# Patient Record
Sex: Female | Born: 2000
Health system: Southern US, Community
[De-identification: ages and names within clinical notes are randomized; demographics above are authoritative.]

## PROBLEM LIST (undated history)

## (undated) DIAGNOSIS — C801 Malignant (primary) neoplasm, unspecified: Secondary | ICD-10-CM

## (undated) DIAGNOSIS — F909 Attention-deficit hyperactivity disorder, unspecified type: Secondary | ICD-10-CM

## (undated) HISTORY — DX: Attention-deficit hyperactivity disorder, unspecified type: F90.9

## (undated) HISTORY — PX: WISDOM TOOTH EXTRACTION: SHX21

---
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
## 2021-06-13 ENCOUNTER — Ambulatory Visit (HOSPITAL_COMMUNITY)
Admission: EM | Admit: 2021-06-13 | Discharge: 2021-06-13 | Disposition: A | Discharge status: Home / Self Care | Payer: Commercial Managed Care - PPO | Attending: Emergency Medicine | Admitting: Emergency Medicine

## 2021-06-13 ENCOUNTER — Encounter (HOSPITAL_COMMUNITY): Payer: Self-pay | Admitting: Emergency Medicine

## 2021-06-13 ENCOUNTER — Other Ambulatory Visit: Payer: Self-pay

## 2021-06-13 DIAGNOSIS — J069 Acute upper respiratory infection, unspecified: Secondary | ICD-10-CM | POA: Insufficient documentation

## 2021-06-13 LAB — POC INFLUENZA A AND B ANTIGEN (URGENT CARE ONLY)
INFLUENZA A ANTIGEN, POC: NEGATIVE
INFLUENZA B ANTIGEN, POC: NEGATIVE

## 2021-06-13 LAB — POCT RAPID STREP A, ED / UC: Streptococcus, Group A Screen (Direct): NEGATIVE

## 2021-06-13 NOTE — ED Triage Notes (Signed)
Fevers, congestion, ear pain, sore throat for several days.

## 2021-06-13 NOTE — Discharge Instructions (Signed)
You most likely have viral illness.  Antibiotics do not help with viral illnesses.  You can take over-the-counter medications to help with symptom management.   You can take Tylenol and/or Ibuprofen as needed for fever reduction and pain relief.   For cough: honey 1/2 to 1 teaspoon (you can dilute the honey in water or another fluid).  You can also use guaifenesin and dextromethorphan for cough. You can use a humidifier for chest congestion and cough.  If you don't have a humidifier, you can sit in the bathroom with the hot shower running.     For sore throat: try warm salt water gargles, cepacol lozenges, throat spray, warm tea or water with lemon/honey, popsicles or ice, or OTC cold relief medicine for throat discomfort.    For congestion: take a daily anti-histamine like Zyrtec, Claritin, and a oral decongestant, such as pseudoephedrine.  You can also use Flonase 1-2 sprays in each nostril daily.    It is important to stay hydrated: drink plenty of fluids (water, gatorade/powerade/pedialyte, juices, or teas) to keep your throat moisturized and help further relieve irritation/discomfort.   Return or go to the Emergency Department if symptoms worsen or do not improve in the next few days.

## 2021-06-13 NOTE — ED Provider Notes (Signed)
[facility]    CSN: [id number] Arrival date & time: 06/13/21  1519      History   Chief Complaint Chief Complaint  Patient presents with   Fever   Sore Throat    HPI Julie Morales is a 20 y.o. female.   Patient here for fever, congestion, ear pain, and sore throat that has been ongoing since Friday.  Reports temperature was 100.3 at home, temp 99 in office.  Reports taking Tylenol, DayQuil, and NyQuil with minimal symptom relief.  Denies any recent sick contacts but states suite mates having similar symptoms.  Reports doing 2 at home COVID test that were negative.  Denies any trauma, injury, or other precipitating event.  Denies any specific alleviating or aggravating factors.  Denies any chest pain, shortness of breath, N/V/D, numbness, tingling, weakness, abdominal pain, or headaches.    The history is provided by the patient and a parent.  Fever Associated symptoms: congestion, cough and sore throat   Sore Throat   Past Medical History:  Diagnosis Date   ADHD (attention deficit hyperactivity disorder)     There are no problems to display for this patient.   History reviewed. No pertinent surgical history.  OB History   No obstetric history on file.      Home Medications    Prior to Admission medications   Medication Sig Start Date End Date Taking? Authorizing Provider  amoxicillin (AMOXIL) 250 MG capsule Take 1 capsule (250 mg total) by mouth 3 (three) times daily. 09/25/15   Robyn Haber, MD  amphetamine-dextroamphetamine (ADDERALL XR) 25 MG 24 hr capsule Take 25 mg by mouth once.    [provider]  cetirizine (ZYRTEC) 10 MG chewable tablet Chew 10 mg by mouth daily.    [provider]  lisdexamfetamine (VYVANSE) 40 MG capsule Take 40 mg by mouth every morning.    [provider]    Family History No family history on file.  Social History Social History   Tobacco Use   Smoking status: Never   Smokeless  tobacco: Never  Substance Use Topics   Alcohol use: No   Drug use: No     Allergies   Patient has no known allergies.   Review of Systems Review of Systems  Constitutional:  Positive for fever.  HENT:  Positive for congestion and sore throat.   Respiratory:  Positive for cough.   All other systems reviewed and are negative.   Physical Exam Triage Vital Signs ED Triage Vitals  Enc Vitals Group     BP 06/13/21 1702 124/86     Pulse Rate 06/13/21 1702 90     Resp 06/13/21 1702 18     Temp 06/13/21 1702 99 F (37.2 C)     Temp Source 06/13/21 1702 Oral     SpO2 06/13/21 1702 98 %     Weight --      Height --      Head Circumference --      Peak Flow --      Pain Score 06/13/21 1659 6     Pain Loc --      Pain Edu? --      Excl. in [place]? --    No data found.  Updated Vital Signs BP 124/86 (BP Location: Right Arm)   Pulse 90   Temp 99 F (37.2 C) (Oral)   Resp 18   LMP 06/09/2021   SpO2 98%   Visual Acuity Right Eye Distance:  Left Eye Distance:   Bilateral Distance:    Right Eye Near:   Left Eye Near:    Bilateral Near:     Physical Exam Vitals and nursing note reviewed.  Constitutional:      General: She is not in acute distress.    Appearance: Normal appearance. She is not ill-appearing, toxic-appearing or diaphoretic.  HENT:     Head: Normocephalic and atraumatic.     Right Ear: Tympanic membrane, ear canal and external ear normal.     Left Ear: Tympanic membrane, ear canal and external ear normal.     Nose: Congestion and rhinorrhea present.     Mouth/Throat:     Pharynx: Uvula midline. Pharyngeal swelling and posterior oropharyngeal erythema present.     Tonsils: No tonsillar exudate or tonsillar abscesses. 2+ on the right. 2+ on the left.  Eyes:     Conjunctiva/sclera: Conjunctivae normal.  Cardiovascular:     Rate and Rhythm: Normal rate and regular rhythm.     Pulses: Normal pulses.     Heart sounds: Normal heart sounds.  Pulmonary:      Effort: Pulmonary effort is normal.     Breath sounds: Normal breath sounds.  Abdominal:     General: Abdomen is flat.  Musculoskeletal:        General: Normal range of motion.     Cervical back: Normal range of motion.  Skin:    General: Skin is warm and dry.  Neurological:     General: No focal deficit present.     Mental Status: She is alert and oriented to person, place, and time.  Psychiatric:        Mood and Affect: Mood normal.     UC Treatments / Results  Labs (all labs ordered are listed, but only abnormal results are displayed) Labs Reviewed  CULTURE, GROUP A STREP [facility])  POCT RAPID STREP A, ED / UC  POC INFLUENZA A AND B ANTIGEN (URGENT CARE ONLY)    EKG   Radiology No results found.  Procedures Procedures (including critical care time)  Medications Ordered in UC Medications - No data to display  Initial Impression / Assessment and Plan / UC Course  I have reviewed the triage vital signs and the nursing notes.  Pertinent labs & imaging results that were available during my care of the patient were reviewed by me and considered in my medical decision making (see chart for details).    Assessment negative for red flags or concerns.  Rapid strep negative, throat culture pending.  Flu swab negative.  This is likely a viral illness.  Declined COVID test as she has had 2 negative at home test.  Tylenol and or ibuprofen as needed.  Encourage fluids and rest.  Discussed conservative symptom management as described in discharge instructions.  School note given to patient.  Follow-up as needed for reevaluation. Final Clinical Impressions(s) / UC Diagnoses   Final diagnoses:  Viral URI     Discharge Instructions      You most likely have viral illness.  Antibiotics do not help with viral illnesses.  You can take over-the-counter medications to help with symptom management.   You can take Tylenol and/or Ibuprofen as needed for fever reduction and pain relief.    For cough: honey 1/2 to 1 teaspoon (you can dilute the honey in water or another fluid).  You can also use guaifenesin and dextromethorphan for cough. You can use a humidifier for chest congestion and cough.  If you don't have a humidifier, you can sit in the bathroom with the hot shower running.     For sore throat: try warm salt water gargles, cepacol lozenges, throat spray, warm tea or water with lemon/honey, popsicles or ice, or OTC cold relief medicine for throat discomfort.    For congestion: take a daily anti-histamine like Zyrtec, Claritin, and a oral decongestant, such as pseudoephedrine.  You can also use Flonase 1-2 sprays in each nostril daily.    It is important to stay hydrated: drink plenty of fluids (water, gatorade/powerade/pedialyte, juices, or teas) to keep your throat moisturized and help further relieve irritation/discomfort.   Return or go to the Emergency Department if symptoms worsen or do not improve in the next few days.      ED Prescriptions   None    PDMP not reviewed this encounter.   Pearson Forster, NP 06/13/21 [phone]

## 2021-06-16 LAB — CULTURE, GROUP A STREP (THRC)

## 2021-10-21 ENCOUNTER — Other Ambulatory Visit (HOSPITAL_COMMUNITY): Payer: Self-pay

## 2021-10-21 MED ORDER — AMPHETAMINE-DEXTROAMPHET ER 25 MG PO CP24
25.0000 mg | ORAL_CAPSULE | Freq: Every morning | ORAL | 0 refills | Status: AC
  Filled 2021-10-21 – 2021-10-24 (×2): qty 30, 30d supply, fill #0

## 2021-10-24 ENCOUNTER — Other Ambulatory Visit (HOSPITAL_COMMUNITY): Payer: Self-pay

## 2022-05-13 ENCOUNTER — Emergency Department (HOSPITAL_COMMUNITY): Payer: Commercial Managed Care - PPO

## 2022-05-13 ENCOUNTER — Emergency Department (HOSPITAL_COMMUNITY)
Admission: EM | Admit: 2022-05-13 | Discharge: 2022-05-13 | Disposition: A | Discharge status: Home / Self Care | Payer: Commercial Managed Care - PPO | Attending: Emergency Medicine | Admitting: Emergency Medicine

## 2022-05-13 ENCOUNTER — Encounter (HOSPITAL_COMMUNITY): Payer: Self-pay | Admitting: Emergency Medicine

## 2022-05-13 DIAGNOSIS — K8020 Calculus of gallbladder without cholecystitis without obstruction: Secondary | ICD-10-CM | POA: Diagnosis not present

## 2022-05-13 DIAGNOSIS — R7401 Elevation of levels of liver transaminase levels: Secondary | ICD-10-CM | POA: Diagnosis not present

## 2022-05-13 DIAGNOSIS — R1013 Epigastric pain: Secondary | ICD-10-CM | POA: Diagnosis present

## 2022-05-13 LAB — URINALYSIS, ROUTINE W REFLEX MICROSCOPIC
Bilirubin Urine: NEGATIVE
Glucose, UA: NEGATIVE mg/dL
Hgb urine dipstick: NEGATIVE
Ketones, ur: NEGATIVE mg/dL
Leukocytes,Ua: NEGATIVE
Nitrite: NEGATIVE
Protein, ur: 30 mg/dL — AB
Specific Gravity, Urine: 1.015 (ref 1.005–1.030)
pH: 9 — ABNORMAL HIGH (ref 5.0–8.0)

## 2022-05-13 LAB — COMPREHENSIVE METABOLIC PANEL
ALT: 191 U/L — ABNORMAL HIGH (ref 0–44)
AST: 155 U/L — ABNORMAL HIGH (ref 15–41)
Albumin: 3.8 g/dL (ref 3.5–5.0)
Alkaline Phosphatase: 314 U/L — ABNORMAL HIGH (ref 38–126)
Anion gap: 9 (ref 5–15)
BUN: 9 mg/dL (ref 6–20)
CO2: 21 mmol/L — ABNORMAL LOW (ref 22–32)
Calcium: 9.4 mg/dL (ref 8.9–10.3)
Chloride: 105 mmol/L (ref 98–111)
Creatinine, Ser: 0.64 mg/dL (ref 0.44–1.00)
GFR, Estimated: >60 mL/min (ref >60)
Glucose, Bld: 135 mg/dL — ABNORMAL HIGH (ref 70–99)
Potassium: 4 mmol/L (ref 3.5–5.1)
Sodium: 135 mmol/L (ref 135–145)
Total Bilirubin: 1.1 mg/dL (ref 0.3–1.2)
Total Protein: 7.5 g/dL (ref 6.5–8.1)

## 2022-05-13 LAB — PREGNANCY, URINE: Preg Test, Ur: NEGATIVE

## 2022-05-13 LAB — CBC
HCT: 37.2 % (ref 36.0–46.0)
Hemoglobin: 12.3 g/dL (ref 12.0–15.0)
MCH: 29.7 pg (ref 26.0–34.0)
MCHC: 33.1 g/dL (ref 30.0–36.0)
MCV: 89.9 fL (ref 80.0–100.0)
Platelets: 229 10*3/uL (ref 150–400)
RBC: 4.14 MIL/uL (ref 3.87–5.11)
RDW: 13 % (ref 11.5–15.5)
WBC: 8.2 10*3/uL (ref 4.0–10.5)
nRBC: 0 % (ref 0.0–0.2)

## 2022-05-13 LAB — I-STAT BETA HCG BLOOD, ED (MC, WL, AP ONLY): I-stat hCG, quantitative: <5 m[IU]/mL (ref <5)

## 2022-05-13 LAB — LIPASE, BLOOD: Lipase: 25 U/L (ref 11–51)

## 2022-05-13 MED ORDER — ALUM & MAG HYDROXIDE-SIMETH 200-200-20 MG/5ML PO SUSP: 30.0000 mL | Freq: Once | ORAL | Status: DC

## 2022-05-13 MED ORDER — FAMOTIDINE IN NACL 20-0.9 MG/50ML-% IV SOLN
20.0000 mg | Freq: Once | INTRAVENOUS | Status: AC
  Administered 2022-05-13: 20 mg via INTRAVENOUS
  Filled 2022-05-13: qty 50

## 2022-05-13 NOTE — ED Provider Notes (Signed)
[facility] DEPT Provider Note   CSN: [id number] Arrival date & time: 05/13/22  1941     History  Chief Complaint  Patient presents with  . Abdominal Pain    Julie Morales is a 21 y.o. female.  Patient is a 21 year old female with a past medical history of ADHD presenting to the emergency department with abdominal pain.  Patient states that she ate pizza and chocolate milk for lunch and shortly after started to develop severe epigastric pain that radiates across the top of her abdomen.  She states it feels like a pressure type of pain.  She denies any nausea or vomiting, fevers or chills, diarrhea or constipation, dysuria or hematuria.  She states that she tried taking Gas-X and Tylenol for pain without significant relief.  She states that she has had similar types of pain in the past with eating that she always thought was due to gas and normally would go away within an hour and has never had the pain last this long.  She denies any prior abdominal surgeries.  The history is provided by the patient and a relative.  Abdominal Pain      Home Medications Prior to Admission medications   Medication Sig Start Date End Date Taking? Authorizing Provider  amoxicillin (AMOXIL) 250 MG capsule Take 1 capsule (250 mg total) by mouth 3 (three) times daily. 09/25/15   Robyn Haber, MD  amphetamine-dextroamphetamine (ADDERALL XR) 25 MG 24 hr capsule Take 25 mg by mouth once.    [provider]  amphetamine-dextroamphetamine (ADDERALL XR) 25 MG 24 hr capsule Take 1 capsule by mouth every morning. 10/21/21     cetirizine (ZYRTEC) 10 MG chewable tablet Chew 10 mg by mouth daily.    [provider]  lisdexamfetamine (VYVANSE) 40 MG capsule Take 40 mg by mouth every morning.    [provider]      Allergies    Patient has no known allergies.    Review of Systems   Review of Systems  Gastrointestinal:  Positive for abdominal pain.     Physical Exam Updated Vital Signs BP (!) 110/57   Pulse 60   Temp 97.7 F (36.5 C)   Resp 15   LMP 04/29/2022   SpO2 98%  Physical Exam Vitals and nursing note reviewed.  Constitutional:      Appearance: She is well-developed.     Comments: Uncomfortable appearing  HENT:     Head: Normocephalic and atraumatic.     Mouth/Throat:     Mouth: Mucous membranes are moist.     Pharynx: Oropharynx is clear.  Eyes:     Extraocular Movements: Extraocular movements intact.     Pupils: Pupils are equal, round, and reactive to light.  Cardiovascular:     Rate and Rhythm: Normal rate and regular rhythm.  Pulmonary:     Effort: Pulmonary effort is normal.     Breath sounds: Normal breath sounds.  Abdominal:     General: Abdomen is flat.     Palpations: Abdomen is soft.     Tenderness: There is abdominal tenderness in the epigastric area. There is no guarding or rebound.  Skin:    General: Skin is warm and dry.  Neurological:     General: No focal deficit present.     Mental Status: She is alert and oriented to person, place, and time.  Psychiatric:        Mood and Affect: Mood normal.  Behavior: Behavior normal.     ED Results / Procedures / Treatments   Labs (all labs ordered are listed, but only abnormal results are displayed) Labs Reviewed  COMPREHENSIVE METABOLIC PANEL - Abnormal; Notable for the following components:      Result Value   CO2 21 (*)    Glucose, Bld 135 (*)    AST 155 (*)    ALT 191 (*)    Alkaline Phosphatase 314 (*)    All other components within normal limits  URINALYSIS, ROUTINE W REFLEX MICROSCOPIC - Abnormal; Notable for the following components:   APPearance TURBID (*)    pH 9.0 (*)    Protein, ur 30 (*)    Bacteria, UA RARE (*)    All other components within normal limits  LIPASE, BLOOD  CBC  PREGNANCY, URINE  I-STAT BETA HCG BLOOD, ED ([facility], [facility], [facility] ONLY)    EKG None  Radiology US Abdomen Limited RUQ (LIVER/GB)  Result  Date: 05/13/2022 CLINICAL DATA:  Right upper quadrant pain EXAM: ULTRASOUND ABDOMEN LIMITED RIGHT UPPER QUADRANT COMPARISON:  None Available. FINDINGS: Gallbladder: Multiple gallstones within the gallbladder measuring up to 14 mm. No wall thickening or sonographic Murphy sign. Common bile duct: Diameter: Normal caliber, 3 mm Liver: No focal lesion identified. Within normal limits in parenchymal echogenicity. Portal vein is patent on color Doppler imaging with normal direction of blood flow towards the liver. Other: None. IMPRESSION: Cholelithiasis.  No sonographic evidence of acute cholecystitis. Electronically Signed   By: Rolm Baptise M.D.   On: 05/13/2022 21:24    Procedures Procedures    Medications Ordered in ED Medications  famotidine (PEPCID) IVPB 20 mg premix (20 mg Intravenous New Bag/Given 05/13/22 2118)    ED Course/ Medical Decision Making/ A&P Clinical Course as of 05/13/22 2206  Sat May 13, 2022  2057 CMP with transaminitis and elevated ALK concerning for gallbladder pathology. [place] pending at this time. [VK]  2204 Upon reassessment, patient's ultrasound is positive for cholelithiasis without cholecystitis.  The patient states that her pain has resolved.  She has no evidence of biliary dilation making choledocholithiasis less likely.  She was recommended primary care follow-up for repeat LFTs and surgery follow-up.  She was given strict return precautions. [VK]    Clinical Course User Index [VK] Ottie Glazier, DO                           Medical Decision Making This patient presents to the ED with chief complaint(s) of abdominal pain with pertinent past medical history of ADHD which further complicates the presenting complaint. The complaint involves an extensive differential diagnosis and also carries with it a high risk of complications and morbidity.    The differential diagnosis includes cholecystitis, cholelithiasis, choledocholithiasis, pancreatitis, gastritis, GERD,  pregnancy, possible Rochele Raring  Additional history obtained: Additional history obtained from family Records reviewed Care Everywhere/External Records  ED Course and Reassessment: Patient was initially evaluated by triage and had labs and urine to evaluate for cause of her pain.  She will additionally right upper quadrant ultrasound to evaluate for gallbladder disease.  Patient was offered morphine for pain but declined and will be trialed Pepcid for possible gastritis or GERD  Independent labs interpretation:  The following labs were independently interpreted: mild transaminitis, otherwise within normal rage  Independent visualization of imaging: - I independently visualized the following imaging with scope of interpretation limited to determining acute life threatening conditions related to  emergency care: RUQ [place], which revealed cholelithiasis without cholecystitis   Consultation: - Consulted or discussed management/test interpretation w/ external professional: N/A  Consideration for admission or further workup: Patient is stable for discharge home with primary care follow-up for repeat labs and surgery follow-up for definitive management Social Determinants of health: N/A    Amount and/or Complexity of Data Reviewed Labs: ordered. Radiology: ordered.  Risk Prescription drug management.           Final Clinical Impression(s) / ED Diagnoses Final diagnoses:  Calculus of gallbladder without cholecystitis without obstruction  Transaminitis    Rx / DC Orders ED Discharge Orders          Ordered    Ambulatory referral to General Surgery        05/13/22 2202              Ottie Glazier, DO 05/13/22 2206

## 2022-05-13 NOTE — ED Triage Notes (Signed)
Patient c/o abdominal pain x5 hours.

## 2022-05-13 NOTE — Discharge Instructions (Signed)
You were seen in the emergency department for your abdominal pain.  Your ultrasound shows that you have gallstones and showed no signs of gallbladder infection or stone stuck in your gallbladder ducts.  Your liver enzymes were mildly elevated though you should follow-up with your primary doctor to have these rechecked within the next week.  You can follow-up with surgery as an outpatient to have your gallbladder removed.  You should try to avoid fatty or fried foods to help with preventing gallbladder pain.  You can take Tylenol or Motrin as needed for pain.  You should return to the emergency department if you have significantly worsening abdominal pain, repetitive vomiting, fevers, or if you have any other new or concerning symptoms.

## 2022-06-08 ENCOUNTER — Ambulatory Visit (INDEPENDENT_AMBULATORY_CARE_PROVIDER_SITE_OTHER): Payer: Commercial Managed Care - PPO | Admitting: Orthopedic Surgery

## 2022-06-08 ENCOUNTER — Ambulatory Visit (INDEPENDENT_AMBULATORY_CARE_PROVIDER_SITE_OTHER): Payer: Commercial Managed Care - PPO

## 2022-06-08 ENCOUNTER — Ambulatory Visit: Payer: Commercial Managed Care - PPO | Admitting: Surgery

## 2022-06-08 ENCOUNTER — Ambulatory Visit: Payer: 59 | Admitting: Surgery

## 2022-06-08 DIAGNOSIS — M546 Pain in thoracic spine: Secondary | ICD-10-CM | POA: Diagnosis not present
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
## 2022-07-20 ENCOUNTER — Ambulatory Visit: Payer: Commercial Managed Care - PPO | Admitting: Orthopedic Surgery

## 2022-07-21 ENCOUNTER — Ambulatory Visit: Payer: Self-pay | Admitting: Surgery

## 2022-07-31 NOTE — Progress Notes (Signed)
COVID Vaccine received:  _0  No _1  Yes Date of any COVID positive Test in last 51 days:None  PCP - Ginger Organ, MD Cardiologist - None   Chest x-ray - 08-31-2018 EKG -  09-01-2018 Stress Test -  ECHO -  Cardiac Cath -   PCR screen: _2  Ordered & Completed                      _3   No Order but Needs PROFEND                      _4   N/A for this surgery  Surgery Plan:  _5  Ambulatory                            _6  Outpatient in bed                            _7  Admit  Anesthesia:    _8  General  _9  Spinal                           _10   Choice _11   MAC  Pacemaker / ICD device _12  No _13  Yes        Device order form faxed _14  No    _15   Yes      Faxed to:  Spinal Cord Stimulator:_16  No _17  Yes      (Remind patient to bring remote DOS) Other Implants:   History of Sleep Apnea? _18  No _19  Yes   CPAP used?- _20  No _21  Yes    Does the patient monitor blood sugar? _22  No _23  Yes  _24  N/A  Blood Thinner / Instructions: none Aspirin Instructions:none  ERAS Protocol Ordered: _25  No  _26  Yes PRE-SURGERY _27  ENSURE  _28  G2  _29  No Drink Ordered Patient is to be NPO after: 0430  Comments:   Activity level: Patient can climb a flight of stairs without difficulty; _30  No CP  _31  No SOB.  Patient can perform ADLs without assistance.   Anesthesia review: ADHD, Abn LFTs 05-13-2022 (Upper GI pain in ED)  Patient denies shortness of breath, fever, cough and chest pain at PAT appointment.  Patient verbalized understanding and agreement to the Pre-Surgical Instructions that were given to them at this PAT appointment. Patient was also educated of the need to review these PAT instructions again prior to his/her surgery.I reviewed the appropriate phone numbers to call if they have any and questions or concerns.

## 2022-08-02 ENCOUNTER — Encounter (HOSPITAL_COMMUNITY)
Admission: RE | Admit: 2022-08-02 | Discharge: 2022-08-02 | Disposition: A | Discharge status: Home / Self Care | Payer: Commercial Managed Care - PPO | Source: Ambulatory Visit | Attending: Surgery | Admitting: Surgery

## 2022-08-02 ENCOUNTER — Encounter (HOSPITAL_COMMUNITY): Payer: Self-pay

## 2022-08-02 ENCOUNTER — Other Ambulatory Visit: Payer: Self-pay

## 2022-08-02 VITALS — BP 135/76 | HR 117 | Temp 98.8°F | Resp 20 | Ht 67.0 in | Wt 170.0 lb

## 2022-08-02 DIAGNOSIS — R7989 Other specified abnormal findings of blood chemistry: Secondary | ICD-10-CM | POA: Insufficient documentation

## 2022-08-02 DIAGNOSIS — Z01812 Encounter for preprocedural laboratory examination: Secondary | ICD-10-CM | POA: Insufficient documentation

## 2022-08-02 DIAGNOSIS — Z01818 Encounter for other preprocedural examination: Secondary | ICD-10-CM

## 2022-08-02 HISTORY — DX: Malignant (primary) neoplasm, unspecified: C80.1

## 2022-08-02 LAB — BASIC METABOLIC PANEL
Anion gap: 10 (ref 5–15)
BUN: 9 mg/dL (ref 6–20)
CO2: 22 mmol/L (ref 22–32)
Calcium: 9.6 mg/dL (ref 8.9–10.3)
Chloride: 108 mmol/L (ref 98–111)
Creatinine, Ser: 0.84 mg/dL (ref 0.44–1.00)
GFR, Estimated: >60 mL/min (ref >60)
Glucose, Bld: 127 mg/dL — ABNORMAL HIGH (ref 70–99)
Potassium: 4 mmol/L (ref 3.5–5.1)
Sodium: 140 mmol/L (ref 135–145)

## 2022-08-02 LAB — CBC
HCT: 39.8 % (ref 36.0–46.0)
Hemoglobin: 13.3 g/dL (ref 12.0–15.0)
MCH: 30 pg (ref 26.0–34.0)
MCHC: 33.4 g/dL (ref 30.0–36.0)
MCV: 89.6 fL (ref 80.0–100.0)
Platelets: 239 10*3/uL (ref 150–400)
RBC: 4.44 MIL/uL (ref 3.87–5.11)
RDW: 13 % (ref 11.5–15.5)
WBC: 7.4 10*3/uL (ref 4.0–10.5)
nRBC: 0 % (ref 0.0–0.2)

## 2022-08-02 NOTE — Patient Instructions (Addendum)
SURGICAL WAITING ROOM VISITATION Patients having surgery or a procedure may have no more than 2 support people in the waiting area - these visitors may rotate in the visitor waiting room.   Children under the age of 34 must have an adult with them who is not the patient. If the patient needs to stay at the hospital during part of their recovery, the visitor guidelines for inpatient rooms apply.  PRE-OP VISITATION  Pre-op nurse will coordinate an appropriate time for 1 support person to accompany the patient in pre-op.  This support person may not rotate.  This visitor will be contacted when the time is appropriate for the visitor to come back in the pre-op area.  Please refer to the [facility] website for the visitor guidelines for Inpatients (after your surgery is over and you are in a regular room).  You are not required to quarantine at this time prior to your surgery. However, you must do this: Hand Hygiene often Do NOT share personal items Notify your provider if you are in close contact with someone who has COVID or you develop fever 100.4 or greater, new onset of sneezing, cough, sore throat, shortness of breath or body aches.  If you test positive for Covid or have been in contact with anyone that has tested positive in the last 10 days please notify you surgeon.    Your procedure is scheduled on:  Thursday   August 10, 2022  Report to [facility] Main Entrance: [place] entrance where the [employer] is available.   Report to admitting at: 05:15    AM  +++++Call this number if you have any questions or problems the morning of surgery [phone]  Do not eat food after Midnight the night prior to your surgery/procedure.  After Midnight you may have the following liquids until   04:30      AM / PM DAY OF SURGERY  Clear Liquid Diet Water Black Coffee (sugar ok, NO MILK/CREAM OR CREAMERS)  Tea (sugar ok, NO MILK/CREAM OR CREAMERS) regular and decaf                              Plain Jell-O  with no fruit (NO RED)                                           Fruit ices (not with fruit pulp, NO RED)                                     Popsicles (NO RED)                                                                  Juice: apple, WHITE grape, WHITE cranberry Sports drinks like Gatorade or Powerade (NO RED)                  FOLLOW BOWEL PREP AND ANY ADDITIONAL PRE OP INSTRUCTIONS YOU RECEIVED FROM YOUR SURGEON'S OFFICE!!!   Oral Hygiene is also important to reduce  your risk of infection.        Remember - BRUSH YOUR TEETH THE MORNING OF SURGERY WITH YOUR REGULAR TOOTHPASTE   Take ONLY these medicines the morning of surgery with A SIP OF WATER: Escitalopram (Lexapro)                  You may not have any metal on your body including hair pins, jewelry, and body piercing  Do not wear make-up, lotions, powders, perfumes or deodorant  Do not wear nail polish including gel and S&S, artificial / acrylic nails, or any other type of covering on natural nails including finger and toenails. If you have artificial nails, gel coating, etc., that needs to be removed by a nail salon, Please have this removed prior to surgery. Not doing so may mean that your surgery could be cancelled or delayed if the Surgeon or anesthesia staff feels like they are unable to monitor you safely.   Do not shave 48 hours prior to surgery to avoid nicks in your skin which may contribute to postoperative infections.   Patients discharged on the day of surgery will not be allowed to drive home.  Someone NEEDS to stay with you for the first 24 hours after anesthesia.  Do not bring your home medications to the hospital. The Pharmacy will dispense medications listed on your medication list to you during your admission in the Hospital.  Special Instructions: Bring a copy of your healthcare power of attorney and living will documents the day of surgery, if you wish to have them scanned into  your [facility] Records- EPIC  Please read over the following fact sheets you were given: IF YOU HAVE QUESTIONS ABOUT YOUR PRE-OP INSTRUCTIONS, PLEASE CALL [phone]  ([place])   [facility] - Preparing for Surgery Before surgery, you can play an important role.  Because skin is not sterile, your skin needs to be as free of germs as possible.  You can reduce the number of germs on your skin by washing with CHG (chlorahexidine gluconate) soap before surgery.  CHG is an antiseptic cleaner which kills germs and bonds with the skin to continue killing germs even after washing. Please DO NOT use if you have an allergy to CHG or antibacterial soaps.  If your skin becomes reddened/irritated stop using the CHG and inform your nurse when you arrive at Short Stay. Do not shave (including legs and underarms) for at least 48 hours prior to the first CHG shower.  You may shave your face/neck.  Please follow these instructions carefully:  1.  Shower with CHG Soap the night before surgery and the  morning of surgery.  2.  If you choose to wash your hair, wash your hair first as usual with your normal  shampoo.  3.  After you shampoo, rinse your hair and body thoroughly to remove the shampoo.                             4.  Use CHG as you would any other liquid soap.  You can apply chg directly to the skin and wash.  Gently with a scrungie or clean washcloth.  5.  Apply the CHG Soap to your body ONLY FROM THE NECK DOWN.   Do not use on face/ open  Wound or open sores. Avoid contact with eyes, ears mouth and genitals (private parts).                       Wash face,  Genitals (private parts) with your normal soap.             6.  Wash thoroughly, paying special attention to the area where your  surgery  will be performed.  7.  Thoroughly rinse your body with warm water from the neck down.  8.  DO NOT shower/wash with your normal soap after using and rinsing off the CHG Soap.             9.  Pat yourself dry with a clean towel.            10.  Wear clean pajamas.            11.  Place clean sheets on your bed the night of your first shower and do not  sleep with pets.  ON THE DAY OF SURGERY : Do not apply any lotions/deodorants the morning of surgery.  Please wear clean clothes to the hospital/surgery center.    FAILURE TO FOLLOW THESE INSTRUCTIONS MAY RESULT IN THE CANCELLATION OF YOUR SURGERY  PATIENT SIGNATURE_________________________________  NURSE SIGNATURE__________________________________  ________________________________________________________________________

## 2022-08-09 ENCOUNTER — Ambulatory Visit (INDEPENDENT_AMBULATORY_CARE_PROVIDER_SITE_OTHER): Payer: Commercial Managed Care - PPO | Admitting: Orthopedic Surgery

## 2022-08-09 ENCOUNTER — Ambulatory Visit (INDEPENDENT_AMBULATORY_CARE_PROVIDER_SITE_OTHER): Payer: Commercial Managed Care - PPO

## 2022-08-09 DIAGNOSIS — M546 Pain in thoracic spine: Secondary | ICD-10-CM
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
## 2022-08-10 ENCOUNTER — Ambulatory Visit (HOSPITAL_BASED_OUTPATIENT_CLINIC_OR_DEPARTMENT_OTHER): Payer: Commercial Managed Care - PPO | Admitting: Anesthesiology

## 2022-08-10 ENCOUNTER — Ambulatory Visit (HOSPITAL_COMMUNITY): Payer: Commercial Managed Care - PPO | Admitting: Physician Assistant

## 2022-08-10 ENCOUNTER — Encounter (HOSPITAL_COMMUNITY)
Admission: RE | Disposition: A | Discharge status: Home / Self Care | Payer: Self-pay | Source: Ambulatory Visit | Attending: Surgery

## 2022-08-10 ENCOUNTER — Ambulatory Visit (HOSPITAL_COMMUNITY)
Admission: RE | Admit: 2022-08-10 | Discharge: 2022-08-10 | Disposition: A | Discharge status: Home / Self Care | Payer: Commercial Managed Care - PPO | Source: Ambulatory Visit | Attending: Surgery | Admitting: Surgery

## 2022-08-10 ENCOUNTER — Encounter (HOSPITAL_COMMUNITY): Payer: Self-pay | Admitting: Surgery

## 2022-08-10 ENCOUNTER — Other Ambulatory Visit: Payer: Self-pay

## 2022-08-10 DIAGNOSIS — K8010 Calculus of gallbladder with chronic cholecystitis without obstruction: Secondary | ICD-10-CM | POA: Diagnosis not present

## 2022-08-10 DIAGNOSIS — Z01818 Encounter for other preprocedural examination: Secondary | ICD-10-CM

## 2022-08-10 HISTORY — PX: CHOLECYSTECTOMY: SHX55

## 2022-08-10 LAB — POCT PREGNANCY, URINE: Preg Test, Ur: NEGATIVE

## 2022-08-10 SURGERY — LAPAROSCOPIC CHOLECYSTECTOMY
Anesthesia: General | Site: Abdomen

## 2022-08-10 MED ORDER — GABAPENTIN 300 MG PO CAPS
300.0000 mg | ORAL_CAPSULE | ORAL | Status: AC
  Administered 2022-08-10: 300 mg via ORAL
  Filled 2022-08-10: qty 1

## 2022-08-10 MED ORDER — OXYCODONE HCL 5 MG PO TABS: 5.0000 mg | ORAL_TABLET | Freq: Once | ORAL | Status: DC | PRN

## 2022-08-10 MED ORDER — CHLORHEXIDINE GLUCONATE CLOTH 2 % EX PADS: 6.0000 | MEDICATED_PAD | Freq: Once | CUTANEOUS | Status: DC

## 2022-08-10 MED ORDER — FENTANYL CITRATE (PF) 250 MCG/5ML IJ SOLN
INTRAMUSCULAR | Status: DC | PRN
  Administered 2022-08-10: 50 ug via INTRAVENOUS
  Administered 2022-08-10: 100 ug via INTRAVENOUS
  Administered 2022-08-10 (×2): 50 ug via INTRAVENOUS

## 2022-08-10 MED ORDER — ONDANSETRON HCL 4 MG/2ML IJ SOLN
INTRAMUSCULAR | Status: DC | PRN
  Administered 2022-08-10: 4 mg via INTRAVENOUS

## 2022-08-10 MED ORDER — OXYCODONE HCL 5 MG/5ML PO SOLN: 5.0000 mg | Freq: Once | ORAL | Status: DC | PRN

## 2022-08-10 MED ORDER — DEXAMETHASONE SODIUM PHOSPHATE 10 MG/ML IJ SOLN
INTRAMUSCULAR | Status: AC
  Filled 2022-08-10: qty 1

## 2022-08-10 MED ORDER — PROPOFOL 10 MG/ML IV BOLUS
INTRAVENOUS | Status: DC | PRN
  Administered 2022-08-10: 200 mg via INTRAVENOUS

## 2022-08-10 MED ORDER — KETOROLAC TROMETHAMINE 15 MG/ML IJ SOLN: 15.0000 mg | INTRAMUSCULAR | Status: DC

## 2022-08-10 MED ORDER — PROPOFOL 10 MG/ML IV BOLUS
INTRAVENOUS | Status: AC
  Filled 2022-08-10: qty 20

## 2022-08-10 MED ORDER — OXYCODONE-ACETAMINOPHEN 5-325 MG PO TABS: 1.0000 | ORAL_TABLET | ORAL | 0 refills | Status: AC | PRN

## 2022-08-10 MED ORDER — ORAL CARE MOUTH RINSE: 15.0000 mL | Freq: Once | OROMUCOSAL | Status: AC

## 2022-08-10 MED ORDER — CHLORHEXIDINE GLUCONATE 0.12 % MT SOLN
15.0000 mL | Freq: Once | OROMUCOSAL | Status: AC
  Administered 2022-08-10: 15 mL via OROMUCOSAL

## 2022-08-10 MED ORDER — SUGAMMADEX SODIUM 200 MG/2ML IV SOLN
INTRAVENOUS | Status: DC | PRN
  Administered 2022-08-10: 200 mg via INTRAVENOUS

## 2022-08-10 MED ORDER — LACTATED RINGERS IV SOLN: INTRAVENOUS | Status: DC

## 2022-08-10 MED ORDER — KETOROLAC TROMETHAMINE 30 MG/ML IJ SOLN
INTRAMUSCULAR | Status: DC | PRN
  Administered 2022-08-10: 30 mg via INTRAVENOUS

## 2022-08-10 MED ORDER — CEFAZOLIN SODIUM-DEXTROSE 2-4 GM/100ML-% IV SOLN
2.0000 g | INTRAVENOUS | Status: AC
  Administered 2022-08-10: 2 g via INTRAVENOUS
  Filled 2022-08-10: qty 100

## 2022-08-10 MED ORDER — ONDANSETRON HCL 4 MG/2ML IJ SOLN
INTRAMUSCULAR | Status: AC
  Filled 2022-08-10: qty 2

## 2022-08-10 MED ORDER — HYDROMORPHONE HCL 1 MG/ML IJ SOLN: 0.2500 mg | INTRAMUSCULAR | Status: DC | PRN

## 2022-08-10 MED ORDER — LIDOCAINE 2% (20 MG/ML) 5 ML SYRINGE
INTRAMUSCULAR | Status: DC | PRN
  Administered 2022-08-10: 100 mg via INTRAVENOUS

## 2022-08-10 MED ORDER — MIDAZOLAM HCL 2 MG/2ML IJ SOLN
INTRAMUSCULAR | Status: AC
  Filled 2022-08-10: qty 2

## 2022-08-10 MED ORDER — ACETAMINOPHEN 500 MG PO TABS
1000.0000 mg | ORAL_TABLET | ORAL | Status: AC
  Administered 2022-08-10: 1000 mg via ORAL
  Filled 2022-08-10: qty 2

## 2022-08-10 MED ORDER — ROCURONIUM BROMIDE 10 MG/ML (PF) SYRINGE
PREFILLED_SYRINGE | INTRAVENOUS | Status: DC | PRN
  Administered 2022-08-10: 60 mg via INTRAVENOUS

## 2022-08-10 MED ORDER — MIDAZOLAM HCL 2 MG/2ML IJ SOLN
INTRAMUSCULAR | Status: DC | PRN
  Administered 2022-08-10: 2 mg via INTRAVENOUS

## 2022-08-10 MED ORDER — 0.9 % SODIUM CHLORIDE (POUR BTL) OPTIME
TOPICAL | Status: DC | PRN
  Administered 2022-08-10: 1000 mL

## 2022-08-10 MED ORDER — LACTATED RINGERS IR SOLN
Status: DC | PRN
  Administered 2022-08-10: 1000 mL

## 2022-08-10 MED ORDER — DEXAMETHASONE SODIUM PHOSPHATE 10 MG/ML IJ SOLN
INTRAMUSCULAR | Status: DC | PRN
  Administered 2022-08-10: 10 mg via INTRAVENOUS

## 2022-08-10 MED ORDER — ROCURONIUM BROMIDE 10 MG/ML (PF) SYRINGE
PREFILLED_SYRINGE | INTRAVENOUS | Status: AC
  Filled 2022-08-10: qty 10

## 2022-08-10 MED ORDER — FENTANYL CITRATE (PF) 250 MCG/5ML IJ SOLN
INTRAMUSCULAR | Status: AC
  Filled 2022-08-10: qty 5

## 2022-08-10 MED ORDER — KETOROLAC TROMETHAMINE 30 MG/ML IJ SOLN
INTRAMUSCULAR | Status: AC
  Filled 2022-08-10: qty 1

## 2022-08-10 MED ORDER — BUPIVACAINE-EPINEPHRINE (PF) 0.5% -1:200000 IJ SOLN
INTRAMUSCULAR | Status: AC
  Filled 2022-08-10: qty 30

## 2022-08-10 MED ORDER — ONDANSETRON HCL 4 MG/2ML IJ SOLN: 4.0000 mg | Freq: Once | INTRAMUSCULAR | Status: DC | PRN

## 2022-08-10 MED ORDER — BUPIVACAINE-EPINEPHRINE 0.5% -1:200000 IJ SOLN
INTRAMUSCULAR | Status: DC | PRN
  Administered 2022-08-10: 30 mL

## 2022-08-10 MED ORDER — LIDOCAINE HCL (PF) 2 % IJ SOLN
INTRAMUSCULAR | Status: AC
  Filled 2022-08-10: qty 5

## 2022-08-10 SURGICAL SUPPLY — 45 items
ADH SKN CLS APL DERMABOND .7 (GAUZE/BANDAGES/DRESSINGS) ×1
APL PRP STRL LF DISP 70% ISPRP (MISCELLANEOUS) ×1
APPLIER CLIP ROT 10 11.4 M/L (STAPLE) ×1
APR CLP MED LRG 11.4X10 (STAPLE) ×1
BAG COUNTER SPONGE SURGICOUNT (BAG) IMPLANT
BAG SPEC RTRVL 10 TROC 200 (ENDOMECHANICALS) ×1
BAG SPNG CNTER NS LX DISP (BAG)
CABLE HIGH FREQUENCY MONO STRZ (ELECTRODE) ×1 IMPLANT
CATH URETL OPEN 5X70 (CATHETERS) IMPLANT
CHLORAPREP W/TINT 26 (MISCELLANEOUS) ×1 IMPLANT
CLIP APPLIE ROT 10 11.4 M/L (STAPLE) ×1 IMPLANT
COVER MAYO STAND XLG (MISCELLANEOUS) ×1 IMPLANT
COVER SURGICAL LIGHT HANDLE (MISCELLANEOUS) ×1 IMPLANT
DERMABOND ADVANCED .7 DNX12 (GAUZE/BANDAGES/DRESSINGS) ×1 IMPLANT
DRAPE C-ARM 42X120 X-RAY (DRAPES) IMPLANT
ELECT REM PT RETURN 15FT ADLT (MISCELLANEOUS) ×1 IMPLANT
ENDOLOOP SUT PDS II  0 18 (SUTURE) ×1
ENDOLOOP SUT PDS II 0 18 (SUTURE) ×1 IMPLANT
GLOVE BIO SURGEON STRL SZ7.5 (GLOVE) ×1 IMPLANT
GLOVE BIOGEL PI IND STRL 8 (GLOVE) ×1 IMPLANT
GOWN STRL REUS W/ TWL XL LVL3 (GOWN DISPOSABLE) ×2 IMPLANT
GOWN STRL REUS W/TWL XL LVL3 (GOWN DISPOSABLE) ×2
GRASPER SUT TROCAR 14GX15 (MISCELLANEOUS) IMPLANT
HEMOSTAT SNOW SURGICEL 2X4 (HEMOSTASIS) IMPLANT
IRRIG SUCT STRYKERFLOW 2 WTIP (MISCELLANEOUS) ×1
IRRIGATION SUCT STRKRFLW 2 WTP (MISCELLANEOUS) ×1 IMPLANT
IV CATH 14GX2 1/4 (CATHETERS) ×1 IMPLANT
KIT BASIN OR (CUSTOM PROCEDURE TRAY) ×1 IMPLANT
KIT TURNOVER KIT A (KITS) IMPLANT
NDL INSUFFLATION 14GA 120MM (NEEDLE) ×1 IMPLANT
NEEDLE INSUFFLATION 14GA 120MM (NEEDLE) ×1 IMPLANT
PENCIL SMOKE EVACUATOR (MISCELLANEOUS) IMPLANT
POUCH RETRIEVAL ECOSAC 10 (ENDOMECHANICALS) ×1 IMPLANT
POUCH RETRIEVAL ECOSAC 10MM (ENDOMECHANICALS) ×1
SCISSORS LAP 5X35 DISP (ENDOMECHANICALS) ×1 IMPLANT
SET TUBE SMOKE EVAC HIGH FLOW (TUBING) ×1 IMPLANT
SLEEVE Z-THREAD 5X100MM (TROCAR) ×2 IMPLANT
SPIKE FLUID TRANSFER (MISCELLANEOUS) ×1 IMPLANT
STOPCOCK 4 WAY LG BORE MALE ST (IV SETS) IMPLANT
SUT MNCRL AB 4-0 PS2 18 (SUTURE) ×1 IMPLANT
TOWEL OR 17X26 10 PK STRL BLUE (TOWEL DISPOSABLE) ×1 IMPLANT
TOWEL OR NON WOVEN STRL DISP B (DISPOSABLE) IMPLANT
TRAY LAPAROSCOPIC (CUSTOM PROCEDURE TRAY) ×1 IMPLANT
TROCAR ADV FIXATION 12X100MM (TROCAR) ×1 IMPLANT
TROCAR Z-THREAD OPTICAL 5X100M (TROCAR) ×1 IMPLANT

## 2022-08-10 NOTE — H&P (Signed)
   Admitting Physician: Nickola Major Arman Loy  Service: General surgery  CC: Abdominal pain  Subjective   HPI: Julie Morales is an 21 y.o. female who is here for cholecystectomy  Past Medical History:  Diagnosis Date   ADHD (attention deficit hyperactivity disorder)    Cancer [facility])     Past Surgical History:  Procedure Laterality Date   WISDOM TOOTH EXTRACTION      History reviewed. No pertinent family history.  Social:  reports that she has never smoked. She has never used smokeless tobacco. She reports that she does not drink alcohol and does not use drugs.  Allergies: No Known Allergies  Medications: Current Outpatient Medications  Medication Instructions   amphetamine-dextroamphetamine (ADDERALL XR) 25 MG 24 hr capsule 1 capsule, Oral, Every morning   cetirizine (ZYRTEC) 10 mg, Oral, Daily PRN   desogestrel-ethinyl estradiol (APRI) 0.15-30 MG-MCG tablet 1 tablet, Oral, Daily   escitalopram (LEXAPRO) 10 mg, Oral, Daily   Probiotic Product (PROBIOTIC DAILY PO) 1 tablet, Oral, Daily, Woman's    ROS - all of the below systems have been reviewed with the patient and positives are indicated with bold text General: chills, fever or night sweats Eyes: blurry vision or double vision ENT: epistaxis or sore throat Allergy/Immunology: itchy/watery eyes or nasal congestion Hematologic/Lymphatic: bleeding problems, blood clots or swollen lymph nodes Endocrine: temperature intolerance or unexpected weight changes Breast: new or changing breast lumps or nipple discharge Resp: cough, shortness of breath, or wheezing CV: chest pain or dyspnea on exertion GI: as per HPI GU: dysuria, trouble voiding, or hematuria MSK: joint pain or joint stiffness Neuro: TIA or stroke symptoms Derm: pruritus and skin lesion changes Psych: anxiety and depression  Objective   PE Blood pressure 126/84, pulse 92, temperature 98.5 F (36.9 C), temperature source Oral, resp. rate 18, weight  77.1 kg, last menstrual period 08/10/2022, SpO2 99 %. Constitutional: NAD; conversant; no deformities Eyes: Moist conjunctiva; no lid lag; anicteric; PERRL Neck: Trachea midline; no thyromegaly Lungs: Normal respiratory effort; no tactile fremitus CV: RRR; no palpable thrills; no pitting edema GI: Abd soft, nontender; no palpable hepatosplenomegaly MSK: Normal range of motion of extremities; no clubbing/cyanosis Psychiatric: Appropriate affect; alert and oriented x3 Lymphatic: No palpable cervical or axillary lymphadenopathy  Results for orders placed or performed during the hospital encounter of 08/10/22 (from the past 24 hour(s))  Pregnancy, urine POC     Status: None   Collection Time: 08/10/22  5:33 AM  Result Value Ref Range   Preg Test, Ur NEGATIVE NEGATIVE    Imaging Orders  No imaging studies ordered today     Assessment and Plan   Julie Morales is an 21 y.o. female with gallstones and biliary colic.  I recommended laparoscopic cholecystectomy. The procedure, its risks, benefits and alteratives were discussed and the patient granted consent to proceed.  We will proceed as scheduled.    ICD-10-CM   1. Pre-op testing  Z01.818 POCT Pregnancy, Urine    POCT Pregnancy, Urine       Felicie Morn, MD  [facility] Use [email or web address] to contact on call provider

## 2022-08-10 NOTE — Anesthesia Preprocedure Evaluation (Signed)
Anesthesia Evaluation  Patient identified by MRN, date of birth, ID band Patient awake    Reviewed: Allergy & Precautions, H&P , NPO status , Patient's Chart, lab work & pertinent test results  Airway Mallampati: II  TM Distance: >3 FB Neck ROM: Full    Dental no notable dental hx.    Pulmonary neg pulmonary ROS   Pulmonary exam normal breath sounds clear to auscultation       Cardiovascular negative cardio ROS Normal cardiovascular exam Rhythm:Regular Rate:Normal     Neuro/Psych negative neurological ROS  negative psych ROS   GI/Hepatic negative GI ROS, Neg liver ROS,,,  Endo/Other  negative endocrine ROS    Renal/GU negative Renal ROS  negative genitourinary   Musculoskeletal negative musculoskeletal ROS (+)    Abdominal   Peds negative pediatric ROS (+)  Hematology negative hematology ROS (+)   Anesthesia Other Findings   Reproductive/Obstetrics negative OB ROS                             Anesthesia Physical Anesthesia Plan  ASA: 2  Anesthesia Plan: General   Post-op Pain Management: Toradol IV (intra-op)*   Induction: Intravenous  PONV Risk Score and Plan: 3 and Ondansetron, Dexamethasone, Midazolam and Treatment may vary due to age or medical condition  Airway Management Planned: Oral ETT  Additional Equipment:   Intra-op Plan:   Post-operative Plan: Extubation in OR  Informed Consent: I have reviewed the patients History and Physical, chart, labs and discussed the procedure including the risks, benefits and alternatives for the proposed anesthesia with the patient or authorized representative who has indicated his/her understanding and acceptance.     Dental advisory given  Plan Discussed with: CRNA and Surgeon  Anesthesia Plan Comments:        Anesthesia Quick Evaluation

## 2022-08-10 NOTE — Anesthesia Procedure Notes (Signed)
Procedure Name: Intubation Date/Time: 08/10/2022 7:23 AM  Performed by: Sharlette Dense, CRNAPre-anesthesia Checklist: Patient identified, Emergency Drugs available, Suction available and Patient being monitored Patient Re-evaluated:Patient Re-evaluated prior to induction Oxygen Delivery Method: Circle system utilized Preoxygenation: Pre-oxygenation with 100% oxygen Induction Type: IV induction Ventilation: Mask ventilation without difficulty Laryngoscope Size: Miller and 2 Grade View: Grade I Tube type: Oral Tube size: 7.0 mm Number of attempts: 1 Airway Equipment and Method: Stylet and Oral airway Placement Confirmation: ETT inserted through vocal cords under direct vision, positive ETCO2 and breath sounds checked- equal and bilateral Secured at: 22 cm Tube secured with: Tape Dental Injury: Teeth and Oropharynx as per pre-operative assessment

## 2022-08-10 NOTE — Anesthesia Postprocedure Evaluation (Signed)
Anesthesia Post Note  Patient: Julie Morales  Procedure(s) Performed: LAPAROSCOPIC CHOLECYSTECTOMY (Abdomen)     Patient location during evaluation: PACU Anesthesia Type: General Level of consciousness: awake and alert Pain management: pain level controlled Vital Signs Assessment: post-procedure vital signs reviewed and stable Respiratory status: spontaneous breathing, nonlabored ventilation, respiratory function stable and patient connected to nasal cannula oxygen Cardiovascular status: blood pressure returned to baseline and stable Postop Assessment: no apparent nausea or vomiting Anesthetic complications: no  No notable events documented.  Last Vitals:  Vitals:   08/10/22 0824 08/10/22 0830  BP: (!) 149/82 (!) 143/80  Pulse: (!) 104 83  Resp: 12 13  Temp: 36.7 C   SpO2: 100% 100%    Last Pain:  Vitals:   08/10/22 0830  TempSrc:   PainSc: 1                  Jazleen Robeck S

## 2022-08-10 NOTE — Transfer of Care (Signed)
Immediate Anesthesia Transfer of Care Note  Patient: Julie Morales  Procedure(s) Performed: LAPAROSCOPIC CHOLECYSTECTOMY (Abdomen)  Patient Location: PACU  Anesthesia Type:General  Level of Consciousness: awake and alert   Airway & Oxygen Therapy: Patient Spontanous Breathing and Patient connected to nasal cannula oxygen  Post-op Assessment: Report given to RN and Post -op Vital signs reviewed and stable  Post vital signs: Reviewed and stable  Last Vitals:  Vitals Value Taken Time  BP 149/82 08/10/22 0824  Temp    Pulse 94 08/10/22 0825  Resp 11 08/10/22 0825  SpO2 100 % 08/10/22 0825  Vitals shown include unvalidated device data.  Last Pain:  Vitals:   08/10/22 0541  TempSrc: Oral  PainSc:          Complications: No notable events documented.

## 2022-08-10 NOTE — Discharge Instructions (Signed)
 CHOLECYSTECTOMY POST OPERATIVE INSTRUCTIONS  Thinking Clearly  The anesthesia may cause you to feel different for 1 or 2 days. Do not drive, drink alcohol, or make any big decisions for at least 2 days.  Nutrition When you wake up, you will be able to drink small amounts of liquid. If you do not feel sick, you can slowly advance your diet to regular foods. Continue to drink lots of fluids, usually about 8 to 10 glasses per day. Eat a high-fiber diet so you don't strain during bowel movements. High-Fiber Foods Foods high in fiber include beans, bran cereals and whole-grain breads, peas, dried fruit (figs, apricots, and dates), raspberries, blackberries, strawberries, sweet corn, broccoli, baked potatoes with skin, plums, pears, apples, greens, and nuts. Activity Slowly increase your activity. Be sure to get up and walk every hour or so to prevent blood clots. No heavy lifting or strenuous activity for 4 weeks following surgery to prevent hernias at your incision sites It is normal to feel tired. You may need more sleep than usual.  Get your rest but make sure to get up and move around frequently to prevent blood clots and pneumonia.  Work and Return to School You can go back to work when you feel well enough. Discuss the timing with your surgeon. You can usually go back to school or work 1 week after an operation. If your work requires heavy lifting or strenuous activity you need to be placed on light duty for 4 weeks following surgery. You can return to gym class, sports or other physical activities 4 weeks after surgery.  Wound Care Always wash your hands before and after touching near your incision site. Do not soak in a bathtub until cleared at your follow up appointment. You may take a shower 24 hours after surgery. A small amount of drainage from the incision is normal. If the drainage is thick and yellow or the site is red, you may have an infection, so call your surgeon. If you  have a drain in one of your incisions, it will be taken out in office when the drainage stops. Steri-Strips will fall off in 7 to 10 days or they will be removed during your first office visit. If you have dermabond glue covering over the incision, allow the glue to flake off on its own. Avoid wearing tight or rough clothing. It may rub your incisions and make it harder for them to heal. Protect the new skin, especially from the sun. The sun can burn and cause darker scarring. Your scar will heal in about 4 to 6 weeks and will become softer and continue to fade over the next year.  The cosmetic appearance of the incisions will improve over the course of the first year after surgery. Sensation around your incision will return in a few weeks or months.  Bowel Movements After intestinal surgery, you may have loose watery stools for several days. If watery diarrhea lasts longer than 3 days, contact your surgeon. Pain medication (narcotics) can cause constipation. Increase the fiber in your diet with high-fiber foods if you are constipated. You can take an over the counter stool softener like Colace to avoid constipation.  Additional over the counter medications can also be used if Colace isn't sufficient (for example, Milk of Magnesia or Miralax).  Pain The amount of pain is different for each person. Some people need only 1 to 3 doses of pain control medication, while others need more. Take alternating doses of tylenol   and ibuprofen around the clock for the first five days following surgery.  This will provide a baseline of pain control and help with inflammation.  Take the narcotic pain medication in addition if needed for severe pain.  Contact Your Surgeon at [phone], if you have: Pain in your right upper abdomen like a gallbladder attack. Pain that will not go away Pain that gets worse A fever of more than 101F (38.3C) Repeated vomiting Swelling, redness, bleeding, or bad-smelling  drainage from your wound site Strong abdominal pain No bowel movement or unable to pass gas for 3 days Watery diarrhea lasting longer than 3 days  Pain Control The goal of pain control is to minimize pain, keep you moving and help you heal. Your surgical team will work with you on your pain plan. Most often a combination of therapies and medications are used to control your pain. You may also be given medication (local anesthetic) at the surgical site. This may help control your pain for several days. Extreme pain puts extra stress on your body at a time when your body needs to focus on healing. Do not wait until your pain has reached a level "10" or is unbearable before telling your doctor or nurse. It is much easier to control pain before it becomes severe. Following a laparoscopic procedure, pain is sometimes felt in the shoulder. This is due to the gas inserted into your abdomen during the procedure. Moving and walking helps to decrease the gas and the right shoulder pain.  Use the guide below for ways to manage your post-operative pain. Learn more by going to facs.org/safepaincontrol.  How Intense Is My Pain Common Therapies to Feel Better       I hardly notice my pain, and it does not interfere with my activities.  I notice my pain and it distracts me, but I can still do activities (sitting up, walking, standing).  Non-Medication Therapies  Ice (in a bag, applied over clothing at the surgical site), elevation, rest, meditation, massage, distraction (music, TV, play) walking and mild exercise Splinting the abdomen with pillows +  Non-Opioid Medications Acetaminophen (Tylenol) Non-steroidal anti-inflammatory drugs (NSAIDS) Aspirin, Ibuprofen (Motrin, Advil) Naproxen (Aleve) Take these as needed, when you feel pain. Both acetaminophen and NSAIDs help to decrease pain and swelling (inflammation).      My pain is hard to ignore and is more noticeable even when I rest.  My  pain interferes with my usual activities.  Non-Medication Therapies  +  Non-Opioid medications  Take on a regular schedule (around-the-clock) instead of as needed. (For example, Tylenol every 6 hours at 9:00 am, 3:00 pm, 9:00 pm, 3:00 am and Motrin every 6 hours at 12:00 am, 6:00 am, 12:00 pm, 6:00 pm)         I am focused on my pain, and I am not doing my daily activities.  I am groaning in pain, and I cannot sleep. I am unable to do anything.  My pain is as bad as it could be, and nothing else matters.  Non-Medication Therapies  +  Around-the-Clock Non-Opioid Medications  +  Short-acting opioids  Opioids should be used with other medications to manage severe pain. Opioids block pain and give a feeling of euphoria (feel high). Addiction, a serious side effect of opioids, is rare with short-term (a few days) use.  Examples of short-acting opioids include: Tramadol (Ultram), Hydrocodone (Norco, Vicodin), Hydromorphone (Dilaudid), Oxycodone (Oxycontin)     The above directions have been adapted from   the [employer] Surgical Patient Education Program.  Please refer to the ACS website if needed: PureLie.ch.ashx.   Louanna Raw, MD [facility] [address] [place], [place], [place]  [address] ?  [address], [place], [place]   [address] [phone] ? [phone] ? FAX [phone] Web site: [email or web address]

## 2022-08-10 NOTE — Op Note (Signed)
Patient: Julie Morales ([birth year], [id number])  Date of Surgery: 08/10/2022   Preoperative Diagnosis: CHOLECYSTITIS   Postoperative Diagnosis: CHOLECYSTITIS   Surgical Procedure: LAPAROSCOPIC CHOLECYSTECTOMY:    Operative Team Members:  Surgeon(s) and Role:    * Vern Guerette, Nickola Major, MD - Primary   Anesthesiologist: Myrtie Soman, MD CRNA: Sharlette Dense, CRNA   Anesthesia: General   Fluids:  Total I/O In: -  Out: 25 [NWGNF:62]  Complications: * No complications entered in OR log *  Drains:  none   Specimen:  ID Type Source Tests Collected by Time Destination  1 : Gallbladder Tissue PATH Gallbladder SURGICAL PATHOLOGY Taylie Helder, Nickola Major, MD 08/10/2022 0802      Disposition:  PACU - hemodynamically stable.  Plan of Care: Discharge to home after PACU    Indications for Procedure: BEN SANZ is a 21 y.o. female who presented with gallstones and biliary colic.  History, physical and imaging was concerning for cholecystitis.  Laparoscopic cholecystectomy was recommended for the patient.  The procedure itself, as well as the risks, benefits and alternatives were discussed with the patient.  Risks discussed included but were not limited to the risk of infection, bleeding, damage to nearby structures, need to convert to open procedure, incisional hernia, bile leak, common bile duct injury and the need for additional procedures or surgeries.  With this discussion complete and all questions answered the patient granted consent to proceed.  Findings: Inflamed gallbladder, gallstones  Infection status: Patient: Private Patient Elective Case Case: Elective Infection Present At Time Of Surgery (PATOS): None   Description of Procedure:   On the date stated above, the patient was taken to the operating room suite and placed in supine positioning.  Sequential compression devices were placed on the lower extremities to prevent blood clots.  General endotracheal  anesthesia was induced. Preoperative antibiotics were given.  The patient's abdomen was prepped and draped in the usual sterile fashion.  A time-out was completed verifying the correct patient, procedure, positioning and equipment needed for the case.  We began by anesthetizing the skin with local anesthetic and then making a 5 mm incision just below the umbilicus.  We dissected through the subcutaneous tissues to the fascia.  The fascia was grasped and elevated using a Kocher clamp.  A Veress needle was inserted into the abdomen and the abdomen was insufflated to 15 mmHg.  A 5 mm trocar was inserted in this position under optical guidance and then the abdomen was inspected.  There was no trauma to the underlying viscera with initial trocar placement.  Any abnormal findings, other than inflammation in the right upper quadrant, are listed above in the findings section.  Three additional trocars were placed, one 12 mm trocar in the subxiphoid position, one 5 mm trocar in the midline epigastric area and one 36m trocar in the right upper quadrant subcostally.  These were placed under direct vision without any trauma to the underlying viscera.    The patient was then placed in head up, left side down positioning.  The gallbladder was identified and dissected free from its attachments to the omentum allowing the duodenum to fall away.  The infundibulum of the gallbladder was dissected free working laterally to medially.  The cystic duct and cystic artery were dissected free from surrounding connective tissue.  The infundibulum of the gallbladder was dissected off the cystic plate.  A critical view of safety was obtained with the cystic duct and cystic artery being cleared of connective  tissues and clearly the only two structures entering into the gallbladder with the liver clearly visible behind.  Clips were then applied to the cystic duct and cystic artery and then these structures were divided.  A PDS endoloop was  placed on the cystic duct stump The gallbladder was dissected off the cystic plate, placed in an endocatch bag and removed from the 12 mm subxiphoid port site.  The clips were inspected and appeared effective.  The cystic plate was inspected and hemostasis was obtained using electrocautery.  A suction irrigator was used to clean the operative field.  Attention was turned to closure.  The 12 mm subxiphoid port site was closed using a 0-vicryl suture on a fascial suture passer.  The abdomen was desufflated.  The skin was closed using 4-0 monocryl and dermabond.  All sponge and needle counts were correct at the conclusion of the case.    Louanna Raw, MD General, Bariatric, & Minimally Invasive Surgery [facility], [place]

## 2022-08-11 ENCOUNTER — Encounter (HOSPITAL_COMMUNITY): Payer: Self-pay | Admitting: Surgery

## 2022-08-11 LAB — SURGICAL PATHOLOGY

## 2022-11-03 ENCOUNTER — Emergency Department (HOSPITAL_BASED_OUTPATIENT_CLINIC_OR_DEPARTMENT_OTHER)
Admission: EM | Admit: 2022-11-03 | Discharge: 2022-11-03 | Disposition: A | Discharge status: Home / Self Care | Payer: Commercial Managed Care - PPO | Attending: Emergency Medicine | Admitting: Emergency Medicine

## 2022-11-03 ENCOUNTER — Encounter (HOSPITAL_BASED_OUTPATIENT_CLINIC_OR_DEPARTMENT_OTHER): Payer: Self-pay | Admitting: Emergency Medicine

## 2022-11-03 ENCOUNTER — Other Ambulatory Visit: Payer: Self-pay

## 2022-11-03 DIAGNOSIS — J020 Streptococcal pharyngitis: Secondary | ICD-10-CM | POA: Diagnosis not present

## 2022-11-03 DIAGNOSIS — J029 Acute pharyngitis, unspecified: Secondary | ICD-10-CM | POA: Diagnosis present

## 2022-11-03 DIAGNOSIS — Z20822 Contact with and (suspected) exposure to covid-19: Secondary | ICD-10-CM | POA: Diagnosis not present

## 2022-11-03 LAB — RESP PANEL BY RT-PCR (RSV, FLU A&B, COVID)  RVPGX2
Influenza A by PCR: NEGATIVE
Influenza B by PCR: NEGATIVE
Resp Syncytial Virus by PCR: NEGATIVE
SARS Coronavirus 2 by RT PCR: NEGATIVE

## 2022-11-03 LAB — GROUP A STREP BY PCR: Group A Strep by PCR: NOT DETECTED

## 2022-11-03 MED ORDER — AMOXICILLIN 500 MG PO CAPS
1000.0000 mg | ORAL_CAPSULE | Freq: Once | ORAL | Status: AC
  Administered 2022-11-03: 1000 mg via ORAL
  Filled 2022-11-03: qty 2

## 2022-11-03 MED ORDER — AMOXICILLIN 500 MG PO CAPS: 1000.0000 mg | ORAL_CAPSULE | Freq: Two times a day (BID) | ORAL | 0 refills | Status: AC

## 2022-11-03 MED ORDER — DEXAMETHASONE 4 MG PO TABS
10.0000 mg | ORAL_TABLET | Freq: Once | ORAL | Status: AC
  Administered 2022-11-03: 10 mg via ORAL
  Filled 2022-11-03: qty 3

## 2022-11-03 NOTE — ED Notes (Signed)
Discharge paperwork given and verbally understood. 

## 2022-11-03 NOTE — Discharge Instructions (Signed)
Take tylenol 2 pills 4 times a day and motrin 4 pills 3 times a day.  Drink plenty of fluids.  Return for worsening shortness of breath, headache, confusion. Follow up with your family doctor.   

## 2022-11-03 NOTE — ED Provider Notes (Signed)
[facility] Provider Note   CSN: [id number] Arrival date & time: 11/03/22  0946     History  Chief Complaint  Patient presents with   Sore Throat    Julie Morales is a 22 y.o. female.  22 yo F with a cc of sore throat.  This been ongoing for about 3 to 4 days.  No known sick contacts.  She recently went away for spring break.  Has pain with swallowing.  She looked in the mirror and saw that she had some tonsillar swelling on the right.  Also feels like she has some swollen lymph nodes on that side.  No cough.  No fevers.   Sore Throat       Home Medications Prior to Admission medications   Medication Sig Start Date End Date Taking? Authorizing Provider  amoxicillin (AMOXIL) 500 MG capsule Take 2 capsules (1,000 mg total) by mouth 2 (two) times daily. 11/03/22  Yes Deno Etienne, DO  amphetamine-dextroamphetamine (ADDERALL XR) 25 MG 24 hr capsule Take 1 capsule by mouth every morning. 10/21/21     cetirizine (ZYRTEC) 10 MG chewable tablet Chew 10 mg by mouth daily as needed for allergies.    [provider]  desogestrel-ethinyl estradiol (APRI) 0.15-30 MG-MCG tablet Take 1 tablet by mouth daily.    [provider]  escitalopram (LEXAPRO) 10 MG tablet Take 10 mg by mouth daily. 05/17/22   [provider]  oxyCODONE-acetaminophen (PERCOCET) 5-325 MG tablet Take 1 tablet by mouth every 4 (four) hours as needed for severe pain. 08/10/22 08/10/23  Stechschulte, Nickola Major, MD  Probiotic Product (PROBIOTIC DAILY PO) Take 1 tablet by mouth daily. [facility]    [provider]      Allergies    Patient has no known allergies.    Review of Systems   Review of Systems  Physical Exam Updated Vital Signs BP 128/84 (BP Location: Right Arm)   Pulse (!) 119   Temp 98.6 F (37 C) (Oral)   Resp 15   SpO2 98%  Physical Exam Vitals and nursing note reviewed.  Constitutional:      General: She is not in acute  distress.    Appearance: She is well-developed. She is not diaphoretic.  HENT:     Head: Normocephalic and atraumatic.     Mouth/Throat:     Tonsils: Tonsillar exudate present. 3+ on the right. 2+ on the left.     Comments: Bilateral tonsillar swelling and exudates worse on the right than the left.  Right anterior cervical lymphadenopathy.  Uvula is midline tongue secretions out difficulty no sublingual swelling. Eyes:     Pupils: Pupils are equal, round, and reactive to light.  Cardiovascular:     Rate and Rhythm: Normal rate and regular rhythm.     Heart sounds: No murmur heard.    No friction rub. No gallop.  Pulmonary:     Effort: Pulmonary effort is normal.     Breath sounds: No wheezing or rales.  Abdominal:     General: There is no distension.     Palpations: Abdomen is soft.     Tenderness: There is no abdominal tenderness.  Musculoskeletal:        General: No tenderness.     Cervical back: Normal range of motion and neck supple.  Skin:    General: Skin is warm and dry.  Neurological:     Mental Status: She is alert and oriented to person,  place, and time.  Psychiatric:        Behavior: Behavior normal.     ED Results / Procedures / Treatments   Labs (all labs ordered are listed, but only abnormal results are displayed) Labs Reviewed  RESP PANEL BY RT-PCR (RSV, FLU A&B, COVID)  [id number]  GROUP A STREP BY PCR    EKG None  Radiology No results found.  Procedures Procedures    Medications Ordered in ED Medications  dexamethasone (DECADRON) tablet 10 mg (has no administration in time range)  amoxicillin (AMOXIL) capsule 1,000 mg (has no administration in time range)    ED Course/ Medical Decision Making/ A&P                             Medical Decision Making Risk Prescription drug management.   22 yo F with a chief complaint of a sore throat.  Going on for about 3 to 4 days.  Clinically the patient has strep pharyngitis.  Will treat presumptively.   There is a Decadron here.  PCP follow-up.  10:09 AM:  I have discussed the diagnosis/risks/treatment options with the patient.  Evaluation and diagnostic testing in the emergency department does not suggest an emergent condition requiring admission or immediate intervention beyond what has been performed at this time.  They will follow up with PCP. We also discussed returning to the ED immediately if new or worsening sx occur. We discussed the sx which are most concerning (e.g., sudden worsening pain, fever, inability to tolerate by mouth) that necessitate immediate return. Medications administered to the patient during their visit and any new prescriptions provided to the patient are listed below.  Medications given during this visit Medications  dexamethasone (DECADRON) tablet 10 mg (has no administration in time range)  amoxicillin (AMOXIL) capsule 1,000 mg (has no administration in time range)     The patient appears reasonably screen and/or stabilized for discharge and I doubt any other medical condition or other [facility] requiring further screening, evaluation, or treatment in the ED at this time prior to discharge.          Final Clinical Impression(s) / ED Diagnoses Final diagnoses:  Strep pharyngitis    Rx / DC Orders ED Discharge Orders          Ordered    amoxicillin (AMOXIL) 500 MG capsule  2 times daily        11/03/22 1006              Royal, DO 11/03/22 1009

## 2022-11-03 NOTE — ED Triage Notes (Signed)
Pt reports sore throat and fever that started yesterday.

## 2022-11-03 NOTE — ED Notes (Signed)
Provider aware of V/S and was okay with still being discharged.Marland KitchenMarland Kitchen

## 2023-06-28 ENCOUNTER — Telehealth: Payer: Self-pay | Admitting: Orthopedic Surgery

## 2023-06-28 NOTE — Telephone Encounter (Signed)
Records re-faxed to [facility]. Originally faxed 06/25/23 [phone]

## 2023-07-13 ENCOUNTER — Telehealth: Payer: Self-pay | Admitting: Orthopedic Surgery

## 2023-07-13 NOTE — Telephone Encounter (Signed)
Records re-faxed to [facility] [phone]. Originally faxed 11/4,re-faxed 11/7 and again today.

## 2024-06-23 ENCOUNTER — Encounter: Payer: Self-pay | Admitting: Radiology
